# Patient Record
Sex: Male | Born: 1980
Health system: Southern US, Community
[De-identification: ages and names within clinical notes are randomized; demographics above are authoritative.]

## PROBLEM LIST (undated history)

## (undated) DIAGNOSIS — J45909 Unspecified asthma, uncomplicated: Secondary | ICD-10-CM

## (undated) HISTORY — PX: JOINT REPLACEMENT: SHX530

## (undated) SURGERY — Surgical Case
Anesthesia: *Unknown

---

## 2003-12-03 ENCOUNTER — Emergency Department (HOSPITAL_COMMUNITY): Admission: EM | Admit: 2003-12-03 | Discharge: 2003-12-03 | Payer: Self-pay | Admitting: Family Medicine

## 2009-07-09 ENCOUNTER — Emergency Department (HOSPITAL_COMMUNITY): Admission: EM | Admit: 2009-07-09 | Discharge: 2009-07-09 | Payer: Self-pay | Admitting: Emergency Medicine
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
## 2011-09-12 ENCOUNTER — Emergency Department (HOSPITAL_COMMUNITY)
Admission: EM | Admit: 2011-09-12 | Discharge: 2011-09-12 | Disposition: A | Discharge status: Home / Self Care | Payer: Worker's Compensation | Attending: Emergency Medicine | Admitting: Emergency Medicine

## 2011-09-12 ENCOUNTER — Encounter (HOSPITAL_COMMUNITY): Payer: Self-pay | Admitting: *Deleted

## 2011-09-12 DIAGNOSIS — S91309A Unspecified open wound, unspecified foot, initial encounter: Secondary | ICD-10-CM | POA: Insufficient documentation

## 2011-09-12 DIAGNOSIS — Y9301 Activity, walking, marching and hiking: Secondary | ICD-10-CM | POA: Insufficient documentation

## 2011-09-12 DIAGNOSIS — Y998 Other external cause status: Secondary | ICD-10-CM | POA: Insufficient documentation

## 2011-09-12 DIAGNOSIS — W268XXA Contact with other sharp object(s), not elsewhere classified, initial encounter: Secondary | ICD-10-CM | POA: Insufficient documentation

## 2011-09-12 DIAGNOSIS — S91332A Puncture wound without foreign body, left foot, initial encounter: Secondary | ICD-10-CM

## 2011-09-12 HISTORY — DX: Unspecified asthma, uncomplicated: J45.909

## 2011-09-12 MED ORDER — TETANUS-DIPHTH-ACELL PERTUSSIS 5-2.5-18.5 LF-MCG/0.5 IM SUSP
0.5000 mL | Freq: Once | INTRAMUSCULAR | Status: AC
  Administered 2011-09-12: 0.5 mL via INTRAMUSCULAR
  Filled 2011-09-12: qty 0.5

## 2011-09-12 MED ORDER — HYDROCODONE-ACETAMINOPHEN 5-325 MG PO TABS: 1.0000 | ORAL_TABLET | Freq: Four times a day (QID) | ORAL | Status: AC | PRN

## 2011-09-12 NOTE — ED Provider Notes (Signed)
History     CSN: [id number]  Arrival date & time 09/12/11  2031   First MD Initiated Contact with Patient 09/12/11 2214      Chief Complaint  Patient presents with  . Foot Pain    (Consider location/radiation/quality/duration/timing/severity/associated sxs/prior treatment) HPI Patient presents emergency department after stepping on a nail with his left foot.  Patient believes his last tetanus shot was 5 years ago, but is unsure.  Patient, states that his swelling and pain to the area around the wound.The patient states he is unsure how far the nail went into the foot. The patient denies numbness or weakness. Past Medical History  Diagnosis Date  . Asthma     History reviewed. No pertinent past surgical history.  History reviewed. No pertinent family history.  History  Substance Use Topics  . Smoking status: Never Smoker   . Smokeless tobacco: Not on file  . Alcohol Use: No      Review of Systems All other systems negative except as documented in the HPI. All pertinent positives and negatives as reviewed in the HPI.  Allergies  Penicillins  Home Medications  No current outpatient prescriptions on file.  BP 149/86  Pulse 83  Temp(Src) 98.1 F (36.7 C) (Oral)  Resp 18  Wt 215 lb (97.523 kg)  SpO2 100%  Physical Exam  Nursing note and vitals reviewed. Constitutional: He appears well-developed and well-nourished.  Musculoskeletal:       Feet:    ED Course  Procedures (including critical care time)  Patient's tetanus was updated is advised to soak the foot in warm water with Epsom salts and return here as needed for any worsening in his condition.   MDM          Carlyle Dolly, PA-C 09/12/11 2315

## 2011-09-12 NOTE — ED Provider Notes (Signed)
Medical screening examination/treatment/procedure(s) were performed by non-physician practitioner and as supervising physician I was immediately available for consultation/collaboration.   Forbes Cellar, MD 09/12/11 2355

## 2011-09-12 NOTE — Discharge Instructions (Signed)
Soak in warm water and epsom salts.Return here as needed. Keep area clean with soap and water.

## 2011-09-12 NOTE — ED Notes (Signed)
Pt c/o stepped on nail tonight; left foot; last tetanus five years ago

## 2014-11-27 ENCOUNTER — Emergency Department (HOSPITAL_COMMUNITY)
Admission: EM | Admit: 2014-11-27 | Discharge: 2014-11-27 | Discharge status: Against Medical Advice | Payer: 59 | Source: Home / Self Care | Attending: Family Medicine | Admitting: Family Medicine

## 2014-11-27 ENCOUNTER — Encounter (HOSPITAL_COMMUNITY): Payer: Self-pay | Admitting: *Deleted

## 2014-11-27 DIAGNOSIS — R1310 Dysphagia, unspecified: Secondary | ICD-10-CM

## 2014-11-27 NOTE — ED Provider Notes (Signed)
CSN: [id number]     Arrival date & time 11/27/14  1735 History   First MD Initiated Contact with Patient 11/27/14 1747     Chief Complaint  Patient presents with  . Dysphagia   (Consider location/radiation/quality/duration/timing/severity/associated sxs/prior Treatment) Patient is a 34 y.o. male presenting with GI illness. The history is provided by the patient.  GI Problem This is a new problem. The current episode started yesterday. The problem has been gradually worsening (only able to tolerate sips of liquids today, no solid food, no h/o reflux). Pertinent negatives include no chest pain, no abdominal pain and no shortness of breath. The symptoms are aggravated by swallowing and drinking.    Past Medical History  Diagnosis Date  . Asthma    No past surgical history on file. No family history on file. Social History  Substance Use Topics  . Smoking status: Never Smoker   . Smokeless tobacco: Not on file  . Alcohol Use: No    Review of Systems  Constitutional: Positive for appetite change. Negative for fever.  Respiratory: Negative for shortness of breath.   Cardiovascular: Negative for chest pain.  Gastrointestinal: Negative for abdominal pain.    Allergies  Penicillins  Home Medications   Prior to Admission medications   Not on File   Meds Ordered and Administered this Visit  Medications - No data to display  BP 142/93 mmHg  Pulse 91  Temp(Src) 98 F (36.7 C) (Oral)  Resp 16  SpO2 96% No data found.   Physical Exam  Constitutional: He is oriented to person, place, and time. He appears well-developed and well-nourished. No distress.  HENT:  Mouth/Throat: Oropharynx is clear and moist.  Eyes: Conjunctivae are normal. Pupils are equal, round, and reactive to light.  Neck: Normal range of motion. Neck supple.  Abdominal: Soft. Bowel sounds are normal. There is no tenderness.  Lymphadenopathy:    He has no cervical adenopathy.  Neurological: He is alert  and oriented to person, place, and time.  Skin: Skin is warm and dry.  Nursing note and vitals reviewed.   ED Course  Procedures (including critical care time)  Labs Review Labs Reviewed - No data to display  Imaging Review No results found.   Visual Acuity Review  Right Eye Distance:   Left Eye Distance:   Bilateral Distance:    Right Eye Near:   Left Eye Near:    Bilateral Near:         MDM   1. Dysphagia, idiopathic    Sent for GI eval of swallowing difficulty since yest.    Linna Hoff, MD 11/27/14 [phone]

## 2014-11-27 NOTE — ED Notes (Addendum)
Pt  Was  Advised   To    Be  Transferred   To  The  Er    By  Shuttle and  He  Refused    He  Was  Advised        The  Dangers   Of     Not  Going     He  Said  He  Would  Go later          He  Signed  Release  And  Left  ama      Dr  Zollie Scale  With  Patient      As    well

## 2014-11-27 NOTE — ED Notes (Signed)
Pt    Reports  He  Is  Unable  To swallow     Anything  But small  Amounts  Of  Liquids          he  Reports  The  Symptoms  Began   Today  He  Has  Had  Some  Belching       And  Some  Intolerances  To spicy  Food  But  denys  Any  specefic  gerd     History    Airway is  Intact  And  He  Is   Speaking in  Complete   sentances

## 2014-11-28 ENCOUNTER — Encounter (HOSPITAL_COMMUNITY)
Admission: EM | Disposition: A | Discharge status: Home / Self Care | Payer: Self-pay | Source: Home / Self Care | Attending: Emergency Medicine

## 2014-11-28 ENCOUNTER — Encounter (HOSPITAL_COMMUNITY): Payer: Self-pay

## 2014-11-28 ENCOUNTER — Emergency Department (HOSPITAL_COMMUNITY)
Admission: EM | Admit: 2014-11-28 | Discharge: 2014-11-28 | Disposition: A | Discharge status: Home / Self Care | Payer: 59 | Attending: Emergency Medicine | Admitting: Emergency Medicine

## 2014-11-28 DIAGNOSIS — Z880 Allergy status to penicillin: Secondary | ICD-10-CM | POA: Insufficient documentation

## 2014-11-28 DIAGNOSIS — K200 Eosinophilic esophagitis: Secondary | ICD-10-CM | POA: Diagnosis not present

## 2014-11-28 DIAGNOSIS — J45909 Unspecified asthma, uncomplicated: Secondary | ICD-10-CM | POA: Insufficient documentation

## 2014-11-28 DIAGNOSIS — R1310 Dysphagia, unspecified: Secondary | ICD-10-CM

## 2014-11-28 HISTORY — PX: ESOPHAGOGASTRODUODENOSCOPY: SHX5428

## 2014-11-28 SURGERY — EGD (ESOPHAGOGASTRODUODENOSCOPY)
Anesthesia: Moderate Sedation

## 2014-11-28 MED ORDER — MIDAZOLAM HCL 5 MG/ML IJ SOLN
INTRAMUSCULAR | Status: AC
  Filled 2014-11-28: qty 2

## 2014-11-28 MED ORDER — BUTAMBEN-TETRACAINE-BENZOCAINE 2-2-14 % EX AERO
INHALATION_SPRAY | CUTANEOUS | Status: DC | PRN
  Administered 2014-11-28: 2 via TOPICAL

## 2014-11-28 MED ORDER — FENTANYL CITRATE (PF) 100 MCG/2ML IJ SOLN
INTRAMUSCULAR | Status: AC
  Filled 2014-11-28: qty 2

## 2014-11-28 MED ORDER — SODIUM CHLORIDE 0.9 % IV SOLN
INTRAVENOUS | Status: DC
Start: 2014-11-28 — End: 2014-11-28

## 2014-11-28 NOTE — H&P (Signed)
   [facility] Admission History & Physical  Chief Complaint: Acute onset of dysphagia HPI: John Lamb is an 34 y.o. white male.  Who developed onset of sensation of food stuck in his esophagus while eating steak. He did not eat any after that. This was 2 nights ago. He did not have any trouble handling his secretions but the next morning when he ate breakfast he could not hold any solid food down and continually regurgitated after every attempt. He also has difficulty with liquids but can keep him down. This is all of acute onset. He was seen in the emergency room today and I was consulted to see him. He does have a history of significant belching and intermittent heartburn and has a reflux type sensation when he tries to drink anything in the last 2 days. He does have a history of asthma  Past Medical History  Diagnosis Date  . Asthma     History reviewed. No pertinent past surgical history.   (Not in a hospital admission)  Allergies:  Allergies  Allergen Reactions  . Penicillins     childhood    History reviewed. No pertinent family history.  Social History:  reports that he has never smoked. He does not have any smokeless tobacco history on file. He reports that he does not drink alcohol. His drug history is not on file.  Review of Systems: negative except as above   Blood pressure 113/74, pulse 60, temperature 97.9 F (36.6 C), temperature source Oral, resp. rate 18, height  (1.905 m), weight 95.255 kg (210 lb), SpO2 97 %. Head: Normocephalic, without obvious abnormality, atraumatic Neck: no adenopathy, no carotid bruit, no JVD, supple, symmetrical, trachea midline and thyroid not enlarged, symmetric, no tenderness/mass/nodules Resp: clear to auscultation bilaterally Cardio: regular rate and rhythm, S1, S2 normal, no murmur, click, rub or gallop GI: Abdomen soft nondistended with normoactive bowel sounds. No S4 mainly mass or guarding Extremities:  extremities normal, atraumatic, no cyanosis or edema  No results found for this or any previous visit (from the past 48 hour(s)). No results found.  Assessment: Dysphagia rule out food impaction rule out eosinophilic esophagitis or reflux-related stricture Plan: EGD with possible foreign body removal possible biopsy. Risks rationale and alternatives explained and he wishes to proceed.  Illana Nolting C 11/28/2014, 3:34 PM

## 2014-11-28 NOTE — ED Notes (Signed)
Pt here and reports he feels like his throat is restricted. Pt has still been able to eat. Denies any pain at all. Pt breathing is e/u. No swelling or redness noted to throat.

## 2014-11-28 NOTE — ED Notes (Signed)
Pt transported to ENDO 

## 2014-11-28 NOTE — Discharge Instructions (Signed)
Please read and follow all provided instructions.  Your diagnoses today include:  1. Dysphagia    Tests performed today include:  Vital signs. See below for your results today.   Medications prescribed:   None Take any prescribed medications only as directed.  Home care instructions:  Follow any educational materials contained in this packet.  Maintain good fluid intake and eat soft foods until seen by a GI physician.   Follow-up instructions: Please follow-up with your primary care provider or the gastroenterologist listed in the next 3 days for further evaluation of your symptoms.   Return instructions:   Please return to the Emergency Department if you experience worsening symptoms.   Please return if you have any other emergent concerns.  Additional Information:  Your vital signs today were: BP 136/93 mmHg   Pulse 78   Temp(Src) 97.9 F (36.6 C) (Oral)   Resp 18   Ht  (1.905 m)   Wt 210 lb (95.255 kg)   BMI 26.25 kg/m2   SpO2 98% If your blood pressure (BP) was elevated above 135/85 this visit, please have this repeated by your doctor within one month. --------------

## 2014-11-28 NOTE — ED Provider Notes (Signed)
CSN: [id number]     Arrival date & time 11/28/14  0845 History   First MD Initiated Contact with Patient 11/28/14 [phone]     Chief Complaint  Patient presents with  . Dysphagia     (Consider location/radiation/quality/duration/timing/severity/associated sxs/prior Treatment) HPI Comments: Patient presents with complaint of dysphasia for the past 2 days and heartburn over the past year. Patient states that he has not been able to eat or drink anything except for small sips and bites of solids and liquids over the past 2 days. If he tries to drink multiple sips or swallow multiple bites, he states the food or liquid will come back up. Patient has had worsening heartburn over the past year and he is tried multiple over-the-counter treatments for this without much relief. He states that he eats a lot of spicy foods even though this makes the heart burn symptoms worse. He denies fevers, chest pain, pain with movement of his neck. No other weakness elsewhere. The onset of this condition was acute. The course is constant. Aggravating factors: none. Alleviating factors: none.    The history is provided by the patient and medical records.    Past Medical History  Diagnosis Date  . Asthma    History reviewed. No pertinent past surgical history. History reviewed. No pertinent family history. Social History  Substance Use Topics  . Smoking status: Never Smoker   . Smokeless tobacco: None  . Alcohol Use: No    Review of Systems  Constitutional: Negative for fever.  HENT: Positive for trouble swallowing. Negative for rhinorrhea and sore throat.   Eyes: Negative for redness.  Respiratory: Negative for cough.   Cardiovascular: Negative for chest pain.  Gastrointestinal: Negative for nausea, vomiting, abdominal pain and diarrhea.  Genitourinary: Negative for dysuria.  Musculoskeletal: Negative for myalgias.  Skin: Negative for rash.  Neurological: Negative for headaches.    Allergies   Penicillins  Home Medications   Prior to Admission medications   Not on File   BP 142/93 mmHg  Pulse 93  Temp(Src) 97.9 F (36.6 C) (Oral)  Resp 16  Ht  (1.905 m)  Wt 210 lb (95.255 kg)  BMI 26.25 kg/m2  SpO2 100%   Physical Exam  Constitutional: He appears well-developed and well-nourished.  HENT:  Head: Normocephalic and atraumatic.  Right Ear: Tympanic membrane, external ear and ear canal normal.  Left Ear: Tympanic membrane, external ear and ear canal normal.  Nose: Nose normal. No mucosal edema or rhinorrhea.  Mouth/Throat: Uvula is midline, oropharynx is clear and moist and mucous membranes are normal. Mucous membranes are not dry. No trismus in the jaw. No uvula swelling. No oropharyngeal exudate, posterior oropharyngeal edema, posterior oropharyngeal erythema or tonsillar abscesses.  Eyes: Conjunctivae are normal. Right eye exhibits no discharge. Left eye exhibits no discharge.  Neck: Normal range of motion. Neck supple.  Cardiovascular: Normal rate, regular rhythm and normal heart sounds.   Pulmonary/Chest: Effort normal and breath sounds normal. No respiratory distress. He has no wheezes. He has no rales.  Abdominal: Soft. There is no tenderness.  Neurological: He is alert.  Skin: Skin is warm and dry.  Psychiatric: He has a normal mood and affect.  Nursing note and vitals reviewed.   ED Course  Procedures (including critical care time) Labs Review Labs Reviewed - No data to display  Imaging Review No results found. I have personally reviewed and evaluated these images and lab results as part of my medical decision-making.   EKG Interpretation  None       9:34 AM Patient seen and examined. Work-up initiated. Medications ordered.   Vital signs reviewed and are as follows: BP 136/93 mmHg  Pulse 78  Temp(Src) 97.9 F (36.6 C) (Oral)  Resp 18  Ht 6\' 3"  (1.905 m)  Wt 210 lb (95.255 kg)  BMI 26.25 kg/m2  SpO2 98%  Patient is able to swallow  small sips of water at bedside, states he feels a burning sensation at base of throat a few seconds after swallowing and a gurgling sensation. Discussed with Dr. Dalene Seltzer.   10:36 AM D/c to home with GI referral. Encouraged soft foods and shakes. Return if unable to tolerate fluids, pain, new symptoms. Patient verbalizes understanding and agrees with plan.     11:14 AM Received call from Endo. Patient's wife is an echo tech here. She has found a GI physician to agree to see patient today. Requested that we hold patient here for transport to Endo if bed is available.   MDM   Final diagnoses:  Dysphagia   Patient with dysphasia but able to tolerate some liquids as demonstrated bedside without regurgitation or vomiting. Description of symptoms sounds most like a dysmotility issue. No indications of infection, retropharyngeal abscess, epiglottitis, other deep space abscess and neck. No systemic symptoms of illness include fever. Patient appears well hydrated. No chest pain.    Renne Crigler, PA-C 11/28/14 1115  Alvira Monday, MD 12/01/14 [phone]

## 2014-12-02 ENCOUNTER — Encounter (HOSPITAL_COMMUNITY): Payer: Self-pay | Admitting: Gastroenterology

## 2015-05-20 NOTE — Procedures (Signed)
EGD report: Esophagus: No foreign body, furrowing  and ringlike indentations most consistent with eosinophilic esophagitis. Biopsies taken.  Stomach: Normal  Duodenum: Normal   Impression: Esophagitis consistent with eosinophilic esophagitis, await biopsy results  Plan: Treat with proton pump inhibitor, await biopsies and consider treatment with oral fluticasone

## 2015-06-15 DIAGNOSIS — J45909 Unspecified asthma, uncomplicated: Secondary | ICD-10-CM | POA: Diagnosis not present

## 2015-06-15 DIAGNOSIS — J301 Allergic rhinitis due to pollen: Secondary | ICD-10-CM | POA: Diagnosis not present

## 2016-02-27 DIAGNOSIS — J4531 Mild persistent asthma with (acute) exacerbation: Secondary | ICD-10-CM | POA: Diagnosis not present

## 2016-02-27 DIAGNOSIS — Z7689 Persons encountering health services in other specified circumstances: Secondary | ICD-10-CM | POA: Diagnosis not present

## 2017-09-12 ENCOUNTER — Ambulatory Visit: Payer: Self-pay | Admitting: Family Medicine

## 2018-03-30 DIAGNOSIS — Z760 Encounter for issue of repeat prescription: Secondary | ICD-10-CM | POA: Diagnosis not present

## 2018-04-01 DIAGNOSIS — J4521 Mild intermittent asthma with (acute) exacerbation: Secondary | ICD-10-CM | POA: Diagnosis not present

## 2018-07-06 MED FILL — QVAR REDIHALER 80 MCG/ACT A: 80 | 30 days supply | Qty: 11 | Fill #0

## 2019-02-18 MED FILL — PROAIR RESPICLICK INHAL PWD: 108 (90 BAS | 17 days supply | Qty: 1 | Fill #0

## 2019-02-18 MED FILL — predniSONE 10 MG TABS: 10 | 12 days supply | Qty: 48 | Fill #0

## 2019-03-25 DIAGNOSIS — Z03818 Encounter for observation for suspected exposure to other biological agents ruled out: Secondary | ICD-10-CM | POA: Diagnosis not present

## 2019-05-09 MED FILL — QVAR REDIHALER 80 MCG/ACT A: 80 | 60 days supply | Qty: 11 | Fill #0

## 2019-12-04 MED FILL — QVAR REDIHALER 80 MCG/ACT A: 80 | 60 days supply | Qty: 11 | Fill #2

## 2020-03-21 ENCOUNTER — Other Ambulatory Visit (HOSPITAL_COMMUNITY): Payer: Self-pay

## 2020-03-24 MED FILL — QVAR REDIHALER 80 MCG/ACT A: 80 | 60 days supply | Qty: 11 | Fill #0

## 2020-09-27 ENCOUNTER — Emergency Department (HOSPITAL_BASED_OUTPATIENT_CLINIC_OR_DEPARTMENT_OTHER)
Admission: EM | Admit: 2020-09-27 | Discharge: 2020-09-28 | Disposition: A | Discharge status: Home / Self Care | Payer: No Typology Code available for payment source | Attending: Emergency Medicine | Admitting: Emergency Medicine

## 2020-09-27 ENCOUNTER — Other Ambulatory Visit: Payer: Self-pay

## 2020-09-27 ENCOUNTER — Encounter (HOSPITAL_BASED_OUTPATIENT_CLINIC_OR_DEPARTMENT_OTHER): Payer: Self-pay

## 2020-09-27 ENCOUNTER — Emergency Department (HOSPITAL_BASED_OUTPATIENT_CLINIC_OR_DEPARTMENT_OTHER): Payer: No Typology Code available for payment source

## 2020-09-27 DIAGNOSIS — J45909 Unspecified asthma, uncomplicated: Secondary | ICD-10-CM | POA: Insufficient documentation

## 2020-09-27 DIAGNOSIS — Y92828 Other wilderness area as the place of occurrence of the external cause: Secondary | ICD-10-CM | POA: Diagnosis not present

## 2020-09-27 DIAGNOSIS — T1490XA Injury, unspecified, initial encounter: Secondary | ICD-10-CM

## 2020-09-27 DIAGNOSIS — W25XXXA Contact with sharp glass, initial encounter: Secondary | ICD-10-CM | POA: Insufficient documentation

## 2020-09-27 DIAGNOSIS — S62616B Displaced fracture of proximal phalanx of right little finger, initial encounter for open fracture: Secondary | ICD-10-CM | POA: Insufficient documentation

## 2020-09-27 DIAGNOSIS — Z7951 Long term (current) use of inhaled steroids: Secondary | ICD-10-CM | POA: Diagnosis not present

## 2020-09-27 DIAGNOSIS — Z96652 Presence of left artificial knee joint: Secondary | ICD-10-CM | POA: Insufficient documentation

## 2020-09-27 DIAGNOSIS — S61216A Laceration without foreign body of right little finger without damage to nail, initial encounter: Secondary | ICD-10-CM

## 2020-09-27 DIAGNOSIS — T148XXA Other injury of unspecified body region, initial encounter: Secondary | ICD-10-CM

## 2020-09-27 DIAGNOSIS — S6991XA Unspecified injury of right wrist, hand and finger(s), initial encounter: Secondary | ICD-10-CM | POA: Diagnosis present

## 2020-09-27 MED ORDER — CEPHALEXIN 250 MG PO CAPS
500.0000 mg | ORAL_CAPSULE | Freq: Once | ORAL | Status: AC
  Administered 2020-09-27: 500 mg via ORAL
  Filled 2020-09-27: qty 2

## 2020-09-27 MED ORDER — CEPHALEXIN 250 MG PO CAPS: 250.0000 mg | ORAL_CAPSULE | Freq: Four times a day (QID) | ORAL | 0 refills | Status: DC

## 2020-09-27 MED ORDER — DOXYCYCLINE HYCLATE 100 MG PO TABS
100.0000 mg | ORAL_TABLET | Freq: Once | ORAL | Status: AC
  Administered 2020-09-27: 100 mg via ORAL
  Filled 2020-09-27: qty 1

## 2020-09-27 MED ORDER — DOXYCYCLINE HYCLATE 100 MG PO CAPS: 100.0000 mg | ORAL_CAPSULE | Freq: Two times a day (BID) | ORAL | 0 refills | Status: DC

## 2020-09-27 MED ORDER — LIDOCAINE HCL (PF) 1 % IJ SOLN
30.0000 mL | Freq: Once | INTRAMUSCULAR | Status: AC
  Administered 2020-09-27: 30 mL
  Filled 2020-09-27: qty 30
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
## 2020-09-27 NOTE — Discharge Instructions (Addendum)
Expect a call from [facility] tomorrow - they will try to see you as soon as possible. Keep the finger in splint as there is a fracture at the base of the finger. Take the antibiotics prescribed.  RETURN TO THE ER IF THERE IS INCREASED PAIN, REDNESS, PUS COMING OUT from the wound site.
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
## 2020-09-27 NOTE — ED Notes (Signed)
MD at bedside for sutures 

## 2020-09-28 NOTE — ED Notes (Signed)
Pt verbalizes understanding of discharge instructions. Opportunity for questioning and answers were provided. Armand removed by staff, pt discharged from ED to home. Pt educated to f/u with hand surgery.

## 2021-10-21 ENCOUNTER — Encounter: Payer: Self-pay | Admitting: Family Medicine

## 2021-10-21 ENCOUNTER — Ambulatory Visit (INDEPENDENT_AMBULATORY_CARE_PROVIDER_SITE_OTHER): Payer: No Typology Code available for payment source | Admitting: Family Medicine

## 2021-10-21 VITALS — BP 122/82 | HR 72 | Temp 97.0°F | Ht 73.0 in | Wt 231.0 lb

## 2021-10-21 DIAGNOSIS — K200 Eosinophilic esophagitis: Secondary | ICD-10-CM | POA: Diagnosis not present

## 2021-10-21 DIAGNOSIS — J45909 Unspecified asthma, uncomplicated: Secondary | ICD-10-CM | POA: Diagnosis not present

## 2021-10-21 DIAGNOSIS — S6991XS Unspecified injury of right wrist, hand and finger(s), sequela: Secondary | ICD-10-CM | POA: Diagnosis not present

## 2021-10-21 DIAGNOSIS — S6991XA Unspecified injury of right wrist, hand and finger(s), initial encounter: Secondary | ICD-10-CM | POA: Insufficient documentation

## 2021-10-21 MED ORDER — OMEPRAZOLE MAGNESIUM 20 MG PO TBEC: 20.0000 mg | DELAYED_RELEASE_TABLET | Freq: Every day | ORAL | 2 refills | Status: DC

## 2021-10-21 MED ORDER — FLUTICASONE PROPIONATE HFA 110 MCG/ACT IN AERO: 1.0000 | INHALATION_SPRAY | Freq: Every day | RESPIRATORY_TRACT | 12 refills | Status: DC

## 2021-10-21 MED ORDER — ALBUTEROL SULFATE HFA 108 (90 BASE) MCG/ACT IN AERS: 2.0000 | INHALATION_SPRAY | Freq: Four times a day (QID) | RESPIRATORY_TRACT | 0 refills | Status: DC | PRN

## 2021-10-21 NOTE — Patient Instructions (Signed)
WE are refilling albuterol, starting Flovent for control inhaler For esophogitis, we refilled your omeprazole.
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
## 2021-10-21 NOTE — Assessment & Plan Note (Signed)
Has upcoming planned visit with orthopedics already scheduled

## 2021-10-21 NOTE — Assessment & Plan Note (Signed)
Refilled omeprazole 20 mg daily Follow-up as needed

## 2021-10-21 NOTE — Assessment & Plan Note (Signed)
DC Qvar Start Flovent as prescribed Albuterol inhaler refilled

## 2021-10-26 ENCOUNTER — Encounter: Payer: No Typology Code available for payment source | Admitting: Family Medicine

## 2021-11-24 ENCOUNTER — Encounter: Payer: Self-pay | Admitting: Family Medicine

## 2021-11-24 ENCOUNTER — Ambulatory Visit (INDEPENDENT_AMBULATORY_CARE_PROVIDER_SITE_OTHER): Payer: No Typology Code available for payment source | Admitting: Family Medicine

## 2021-11-24 VITALS — BP 126/82 | HR 77 | Temp 97.5°F | Ht 73.0 in | Wt 235.6 lb

## 2021-11-24 DIAGNOSIS — Z6831 Body mass index (BMI) 31.0-31.9, adult: Secondary | ICD-10-CM | POA: Diagnosis not present

## 2021-11-24 DIAGNOSIS — R222 Localized swelling, mass and lump, trunk: Secondary | ICD-10-CM

## 2021-11-24 DIAGNOSIS — K200 Eosinophilic esophagitis: Secondary | ICD-10-CM

## 2021-11-24 DIAGNOSIS — Z1159 Encounter for screening for other viral diseases: Secondary | ICD-10-CM

## 2021-11-24 DIAGNOSIS — E669 Obesity, unspecified: Secondary | ICD-10-CM | POA: Diagnosis not present

## 2021-11-24 DIAGNOSIS — Z114 Encounter for screening for human immunodeficiency virus [HIV]: Secondary | ICD-10-CM

## 2021-11-24 DIAGNOSIS — Z0000 Encounter for general adult medical examination without abnormal findings: Secondary | ICD-10-CM

## 2021-11-24 LAB — COMPREHENSIVE METABOLIC PANEL
ALT: 31 U/L (ref 0–53)
AST: 24 U/L (ref 0–37)
Albumin: 4.5 g/dL (ref 3.5–5.2)
Alkaline Phosphatase: 57 U/L (ref 39–117)
BUN: 11 mg/dL (ref 6–23)
CO2: 29 mEq/L (ref 19–32)
Calcium: 9.3 mg/dL (ref 8.4–10.5)
Chloride: 100 mEq/L (ref 96–112)
Creatinine, Ser: 1.2 mg/dL (ref 0.40–1.50)
GFR: 76.32 mL/min (ref >60.00)
Glucose, Bld: 98 mg/dL (ref 70–99)
Potassium: 4.2 mEq/L (ref 3.5–5.1)
Sodium: 139 mEq/L (ref 135–145)
Total Bilirubin: 0.4 mg/dL (ref 0.2–1.2)
Total Protein: 7.4 g/dL (ref 6.0–8.3)

## 2021-11-24 LAB — CBC WITH DIFFERENTIAL/PLATELET
Basophils Absolute: 0.1 10*3/uL (ref 0.0–0.1)
Basophils Relative: 1 % (ref 0.0–3.0)
Eosinophils Absolute: 1 10*3/uL — ABNORMAL HIGH (ref 0.0–0.7)
Eosinophils Relative: 14.5 % — ABNORMAL HIGH (ref 0.0–5.0)
HCT: 44.2 % (ref 39.0–52.0)
Hemoglobin: 14.7 g/dL (ref 13.0–17.0)
Lymphocytes Relative: 32 % (ref 12.0–46.0)
Lymphs Abs: 2.2 10*3/uL (ref 0.7–4.0)
MCHC: 33.3 g/dL (ref 30.0–36.0)
MCV: 88.8 fl (ref 78.0–100.0)
Monocytes Absolute: 0.5 10*3/uL (ref 0.1–1.0)
Monocytes Relative: 6.7 % (ref 3.0–12.0)
Neutro Abs: 3.2 10*3/uL (ref 1.4–7.7)
Neutrophils Relative %: 45.8 % (ref 43.0–77.0)
Platelets: 228 10*3/uL (ref 150.0–400.0)
RBC: 4.98 Mil/uL (ref 4.22–5.81)
RDW: 14.8 % (ref 11.5–15.5)
WBC: 7 10*3/uL (ref 4.0–10.5)

## 2021-11-24 LAB — LDL CHOLESTEROL, DIRECT: Direct LDL: 160 mg/dL

## 2021-11-24 LAB — URINALYSIS, ROUTINE W REFLEX MICROSCOPIC
Bilirubin Urine: NEGATIVE
Hgb urine dipstick: NEGATIVE
Ketones, ur: NEGATIVE
Leukocytes,Ua: NEGATIVE
Nitrite: NEGATIVE
RBC / HPF: NONE SEEN (ref 0–?)
Specific Gravity, Urine: 1.015 (ref 1.000–1.030)
Total Protein, Urine: NEGATIVE
Urine Glucose: NEGATIVE
Urobilinogen, UA: 0.2 (ref 0.0–1.0)
pH: 7 (ref 5.0–8.0)

## 2021-11-24 LAB — LIPID PANEL
Cholesterol: 232 mg/dL — ABNORMAL HIGH (ref 0–200)
HDL: 37.9 mg/dL — ABNORMAL LOW (ref >39.00)
NonHDL: 194.23
Total CHOL/HDL Ratio: 6
Triglycerides: 277 mg/dL — ABNORMAL HIGH (ref 0.0–149.0)
VLDL: 55.4 mg/dL — ABNORMAL HIGH (ref 0.0–40.0)

## 2021-11-24 LAB — TSH: TSH: 1.93 u[IU]/mL (ref 0.35–5.50)

## 2021-11-24 LAB — HEMOGLOBIN A1C: Hgb A1c MFr Bld: 5.8 % (ref 4.6–6.5)

## 2021-11-24 MED ORDER — OMEPRAZOLE 20 MG PO CPDR: 20.0000 mg | DELAYED_RELEASE_CAPSULE | Freq: Two times a day (BID) | ORAL | 11 refills | Status: DC

## 2021-11-24 NOTE — Progress Notes (Signed)
Patient ID: John Lamb, male   DOB: 09-21-1980, 41 y.o.   MRN: [id number]

## 2021-11-24 NOTE — Assessment & Plan Note (Signed)
Counseled on exercise and diet Restratification labs as ordered

## 2021-11-24 NOTE — Progress Notes (Signed)
Chief Complaint:  John Lamb is a 41 y.o. male who presents today for his annual comprehensive physical exam.    Assessment/Plan:   Problem List Items Addressed This Visit       Digestive   Eosinophilic esophagitis    Improved on omeprazole 20 mg twice daily Medication refill sent Discussed with patient if no improvement would need to follow-up with GI      Relevant Medications   omeprazole (PRILOSEC) 20 MG capsule     Musculoskeletal and Integument   Nodule of skin of back    Likely lipoma versus sebaceous cyst, although cannot rule out occult malignancy Not bothersome at the moment Did discuss getting further imaging to work-up versus referral for removal and biopsy .  Patient elects to monitor for now      Relevant Orders   Ambulatory referral to Dermatology     Other   Class 1 obesity without serious comorbidity with body mass index (BMI) of 31.0 to 31.9 in adult    Counseled on exercise and diet Restratification labs as ordered      Relevant Orders   CBC with Differential   Comprehensive metabolic panel   Hemoglobin A1c   Lipid panel   TSH   Vitamin D 1,25 dihydroxy   Urinalysis, Routine w reflex microscopic   Other Visit Diagnoses     Routine general medical examination at a health care facility    -  Primary   Screening for HIV (human immunodeficiency virus)       Relevant Orders   HIV antibody   Encounter for hepatitis C screening test for low risk patient       Relevant Orders   Hepatitis C antibody screen        Patient Counseling(The following topics were reviewed and/or handout was given):  -Nutrition: Stressed importance of moderation in sodium/caffeine intake, saturated fat and cholesterol, caloric balance, sufficient intake of fresh fruits, vegetables, and fiber.  -Stressed the importance of regular exercise.   -Substance Abuse: Discussed cessation/primary prevention of tobacco, alcohol, or other drug use; driving or other  dangerous activities under the influence; availability of treatment for abuse.   -Injury prevention: Discussed safety belts, safety helmets, smoke detector, smoking near bedding or upholstery.   -Sexuality: Discussed sexually transmitted diseases, partner selection, use of condoms, avoidance of unintended pregnancy and contraceptive alternatives.   -Dental health: Discussed importance of regular tooth brushing, flossing, and dental visits.  -Health maintenance and immunizations reviewed. Please refer to Health maintenance section.  Return to care in 1 year for next preventative visit.     Subjective:  HPI:  GERD and eosinophilic esophagitis follow up:  The patient was last seen for GERD 1 months ago. Changes made since that visit include he is taking more omeprazole as it does not completely control symptoms.  He reports excellent compliance with treatment. He is not having side effects.   He IS experiencing heartburn, when not taking omeprazole or when he eats spicy food. He is NOT experiencing difficulty swallowing, dysphagia, early satiety, hematemesis, midespigastric pain, shortness of breath, or unexpected weight loss  -----------------------------------------------------------------------------------------   Lifestyle Diet: Some spicy food, Exercise: Reports that he used to go to the gym, he but it does actively race dirt bikes, and do the Peloton occasionally     11/24/2021    9:45 AM  Depression screen PHQ 2/9  Decreased Interest 0  Down, Depressed, Hopeless 0  PHQ - 2 Score 0    Health  Maintenance Due  Topic Date Due   Hepatitis C Screening  Never done       ROS: As per HPI, otherwise all systems reviewed and are negative  PMH:  The following were reviewed and entered/updated in epic: Past Medical History:  Diagnosis Date   Asthma    Patient Active Problem List   Diagnosis Date Noted   Nodule of skin of back 11/24/2021   Class 1 obesity without  serious comorbidity with body mass index (BMI) of 31.0 to 31.9 in adult 11/24/2021   Injury of right little finger 10/21/2021   Uncomplicated asthma 10/21/2021   Eosinophilic esophagitis 10/21/2021   Past Surgical History:  Procedure Laterality Date   ESOPHAGOGASTRODUODENOSCOPY N/A 11/28/2014   Procedure: ESOPHAGOGASTRODUODENOSCOPY (EGD);  Surgeon: Dorena Cookey, MD;  Location: [facility] ENDOSCOPY;  Service: Endoscopy;  Laterality: N/A;   JOINT REPLACEMENT     no metal, left knee    History reviewed. No pertinent family history.  Medications- reviewed and updated Current Outpatient Medications  Medication Sig Dispense Refill   omeprazole (PRILOSEC) 20 MG capsule Take 1 capsule (20 mg total) by mouth 2 (two) times daily before a meal. 60 capsule 11   albuterol (VENTOLIN HFA) 108 (90 Base) MCG/ACT inhaler Inhale 2 puffs into the lungs every 6 (six) hours as needed for wheezing or shortness of breath. (Patient not taking: Reported on 11/24/2021) 8 g 0   fluticasone (FLOVENT HFA) 110 MCG/ACT inhaler Inhale 1 puff into the lungs daily. (Patient not taking: Reported on 11/24/2021) 1 each 12   No current facility-administered medications for this visit.    Allergies-reviewed and updated Allergies  Allergen Reactions   Iodine    Penicillins     childhood    Social History   Socioeconomic History   Marital status: Married    Spouse name: Not on file   Number of children: Not on file   Years of education: Not on file   Highest education level: Not on file  Occupational History   Not on file  Tobacco Use   Smoking status: Never   Smokeless tobacco: Not on file  Vaping Use   Vaping Use: Never used  Substance and Sexual Activity   Alcohol use: Yes    Comment: Social   Drug use: No   Sexual activity: Not on file  Other Topics Concern   Not on file  Social History Narrative   Not on file   Social Determinants of Health   Financial Resource Strain: Not on file  Food Insecurity: Not on  file  Transportation Needs: Not on file  Physical Activity: Not on file  Stress: Not on file  Social Connections: Not on file        Objective:  Physical Exam: BP 126/82 (BP Location: Left Arm, Patient Position: Sitting, Cuff Size: Large)   Pulse 77   Temp (!) 97.5 F (36.4 C) (Temporal)   Ht 6\' 1"  (1.854 m)   Wt 235 lb 9.6 oz (106.9 kg)   SpO2 98%   BMI 31.08 kg/m   Body mass index is 31.08 kg/m. Wt Readings from Last 3 Encounters:  11/24/21 235 lb 9.6 oz (106.9 kg)  10/21/21 231 lb (104.8 kg)  09/27/20 215 lb (97.5 kg)    Gen: NAD, resting comfortably CV: RRR with no murmurs appreciated Pulm: NWOB, CTAB with no crackles, wheezes, or rhonchi GI: Normal bowel sounds present. Soft, Nontender, Nondistended. MSK: no edema, cyanosis, or clubbing noted Skin: warm, dry, 1 cm nodule,  nontender, nonmobile located on midline of upper back Neuro: grossly normal, moves all extremities Psych: Normal affect and thought content      At today's visit, we discussed treatment options, associated risk and benefits, and engage in counseling as needed.  Additionally the following were reviewed: Past medical records, past medical and surgical history, family and social background, as well as relevant laboratory results, imaging findings, and specialty notes, where applicable.  This message was generated using dictation software, and as a result, it may contain unintentional typos or errors.  Nevertheless, extensive effort was made to accurately convey at the pertinent aspects of the patient visit.    There may have been are other unrelated non-urgent complaints, but due to the busy schedule and the amount of time already spent with him, time does not permit to address these issues at today's visit. Another appointment may have or has been requested to review these additional issues.   Thomes Dinning, MD, MS

## 2021-11-24 NOTE — Assessment & Plan Note (Signed)
Likely lipoma versus sebaceous cyst, although cannot rule out occult malignancy Not bothersome at the moment Did discuss getting further imaging to work-up versus referral for removal and biopsy .  Patient elects to monitor for now

## 2021-11-24 NOTE — Assessment & Plan Note (Signed)
Improved on omeprazole 20 mg twice daily Medication refill sent Discussed with patient if no improvement would need to follow-up with GI

## 2021-11-28 LAB — VITAMIN D 1,25 DIHYDROXY
Vitamin D 1, 25 (OH)2 Total: 40 pg/mL (ref 18–72)
Vitamin D2 1, 25 (OH)2: <8 pg/mL
Vitamin D3 1, 25 (OH)2: 40 pg/mL

## 2021-11-28 LAB — HIV ANTIBODY (ROUTINE TESTING W REFLEX): HIV 1&2 Ab, 4th Generation: NONREACTIVE

## 2021-11-28 LAB — HEPATITIS C ANTIBODY: Hepatitis C Ab: NONREACTIVE

## 2022-01-26 ENCOUNTER — Other Ambulatory Visit (HOSPITAL_COMMUNITY): Payer: Self-pay

## 2022-01-26 MED ORDER — ALBUTEROL SULFATE HFA 108 (90 BASE) MCG/ACT IN AERS
2.0000 | INHALATION_SPRAY | Freq: Four times a day (QID) | RESPIRATORY_TRACT | 0 refills | Status: AC | PRN
  Filled 2022-01-26: qty 6.7, 25d supply, fill #0

## 2022-01-26 MED ORDER — OMEPRAZOLE 20 MG PO CPDR
20.0000 mg | DELAYED_RELEASE_CAPSULE | Freq: Two times a day (BID) | ORAL | 9 refills | Status: DC
  Filled 2022-01-26 – 2022-03-04 (×2): qty 60, 30d supply, fill #0
  Filled 2022-06-01: qty 60, 30d supply, fill #1

## 2022-01-26 MED ORDER — FLUTICASONE PROPIONATE HFA 110 MCG/ACT IN AERO
1.0000 | INHALATION_SPRAY | Freq: Every day | RESPIRATORY_TRACT | 12 refills | Status: DC
  Filled 2022-01-26: qty 12, 30d supply, fill #0

## 2022-01-26 MED ORDER — OMEPRAZOLE 20 MG PO CPDR
20.0000 mg | DELAYED_RELEASE_CAPSULE | Freq: Every day | ORAL | 2 refills | Status: DC
  Filled 2022-01-26: qty 30, 30d supply, fill #0

## 2022-03-04 ENCOUNTER — Other Ambulatory Visit (HOSPITAL_COMMUNITY): Payer: Self-pay

## 2022-07-11 ENCOUNTER — Encounter: Payer: Self-pay | Admitting: Family Medicine

## 2022-07-11 DIAGNOSIS — K200 Eosinophilic esophagitis: Secondary | ICD-10-CM

## 2022-07-12 NOTE — Telephone Encounter (Signed)
Caller Name: Florindo Call back phone #: [phone]  Reason for Call: pt called to stress he is going out of the country tomorrow and is asking if this can be filled today.

## 2022-07-13 ENCOUNTER — Other Ambulatory Visit (HOSPITAL_COMMUNITY): Payer: Self-pay

## 2022-07-13 MED ORDER — FLUTICASONE PROPIONATE HFA 110 MCG/ACT IN AERO
1.0000 | INHALATION_SPRAY | Freq: Every day | RESPIRATORY_TRACT | 1 refills | Status: DC
  Filled 2022-07-13: qty 12, 90d supply, fill #0

## 2022-07-13 MED ORDER — OMEPRAZOLE 20 MG PO CPDR
20.0000 mg | DELAYED_RELEASE_CAPSULE | Freq: Two times a day (BID) | ORAL | 1 refills | Status: DC
  Filled 2022-07-13 – 2022-08-18 (×2): qty 180, 90d supply, fill #0
  Filled 2023-02-14: qty 180, 90d supply, fill #1

## 2022-07-13 NOTE — Telephone Encounter (Signed)
[place] [facility] Phone: [phone]  Fax: [phone]     Please send in flovent and omeprazole

## 2022-07-28 ENCOUNTER — Other Ambulatory Visit (HOSPITAL_COMMUNITY): Payer: Self-pay

## 2022-08-18 ENCOUNTER — Other Ambulatory Visit (HOSPITAL_COMMUNITY): Payer: Self-pay

## 2022-08-19 ENCOUNTER — Other Ambulatory Visit (HOSPITAL_COMMUNITY): Payer: Self-pay

## 2023-10-07 ENCOUNTER — Other Ambulatory Visit: Payer: Self-pay | Admitting: Nurse Practitioner

## 2023-10-07 DIAGNOSIS — K200 Eosinophilic esophagitis: Secondary | ICD-10-CM

## 2023-10-10 NOTE — Telephone Encounter (Signed)
 Requesting: omeprazole  (PRILOSEC ) 20 MG capsule  Last Visit: 11/24/2021 Next Visit: Visit date not found Last Refill: 07/13/2022  Please Advise

## 2023-10-11 ENCOUNTER — Other Ambulatory Visit (HOSPITAL_COMMUNITY): Payer: Self-pay

## 2023-10-11 ENCOUNTER — Encounter (HOSPITAL_COMMUNITY): Payer: Self-pay

## 2023-10-19 ENCOUNTER — Other Ambulatory Visit (HOSPITAL_COMMUNITY): Payer: Self-pay

## 2023-10-19 ENCOUNTER — Ambulatory Visit (INDEPENDENT_AMBULATORY_CARE_PROVIDER_SITE_OTHER): Admitting: Family Medicine

## 2023-10-19 ENCOUNTER — Encounter: Payer: Self-pay | Admitting: Family Medicine

## 2023-10-19 VITALS — BP 136/92 | HR 75 | Temp 97.3°F | Ht 73.0 in | Wt 239.0 lb

## 2023-10-19 DIAGNOSIS — Z0000 Encounter for general adult medical examination without abnormal findings: Secondary | ICD-10-CM

## 2023-10-19 DIAGNOSIS — Z6831 Body mass index (BMI) 31.0-31.9, adult: Secondary | ICD-10-CM | POA: Diagnosis not present

## 2023-10-19 DIAGNOSIS — E66811 Obesity, class 1: Secondary | ICD-10-CM

## 2023-10-19 DIAGNOSIS — J4520 Mild intermittent asthma, uncomplicated: Secondary | ICD-10-CM | POA: Diagnosis not present

## 2023-10-19 DIAGNOSIS — K200 Eosinophilic esophagitis: Secondary | ICD-10-CM | POA: Diagnosis not present

## 2023-10-19 DIAGNOSIS — I10 Essential (primary) hypertension: Secondary | ICD-10-CM | POA: Diagnosis not present

## 2023-10-19 MED ORDER — OMEPRAZOLE 20 MG PO CPDR
20.0000 mg | DELAYED_RELEASE_CAPSULE | Freq: Two times a day (BID) | ORAL | 3 refills | Status: AC
  Filled 2023-10-19: qty 180, 90d supply, fill #0
  Filled 2024-04-25: qty 180, 90d supply, fill #1

## 2023-10-19 NOTE — Progress Notes (Signed)
 Assessment  Assessment/Plan:   Assessment and Plan    Eosinophilic Esophagitis (EOE) Chronic eosinophilic esophagitis managed with omeprazole , effectively controlling symptoms, primarily acid reflux. Discussed alternative treatments like Flovent , advising against use without gastroenterologist consultation due to risks such as thrush. He prefers continuing PPI therapy and has difficulty scheduling with his gastroenterologist. Intermittent fasting discussed as a potential method to reduce EOE-related inflammation. - Refill omeprazole  20 mg twice daily - Consider baseline lab work including metabolic panel, liver, kidneys, thyroid, and eosinophil count  Asthma Asthma managed with Qvar, though not used for over a year. Previously required daily use but currently asymptomatic. Discussed insurance coverage issues with Qvar and Flovent , and availability of generics. No refill of Qvar needed.  Hypertension secondary to Obesity Mildly elevated blood pressure potentially associated with weight changes. He reports not being active or following a healthy diet. He is aware of the need to monitor blood pressure and its health impact. - Recommend lifestyle modifications for management - Screen for renal, liver, metabolic, endocrine dysfunction as ordered.  - Monitor blood pressure at a future visit  General Health Maintenance Discussed importance of vaccinations. He is due for pneumonia, tetanus, and Hepatitis B vaccines. - Administer tetanus vaccine - Consider pneumonia and Hepatitis B vaccines        Medications Discontinued During This Encounter  Medication Reason   fluticasone  (FLOVENT  HFA) 110 MCG/ACT inhaler    albuterol  (VENTOLIN  HFA) 108 (90 Base) MCG/ACT inhaler    omeprazole  (PRILOSEC ) 20 MG capsule Reorder    Patient Counseling(The following topics were reviewed and/or handout was given):  -Nutrition: Stressed importance of moderation in sodium/caffeine intake, saturated fat and  cholesterol, caloric balance, sufficient intake of fresh fruits, vegetables, and fiber.  -Stressed the importance of regular exercise.   -Substance Abuse: Discussed cessation/primary prevention of tobacco, alcohol, or other drug use; driving or other dangerous activities under the influence; availability of treatment for abuse.   -Injury prevention: Discussed safety belts, safety helmets, smoke detector, smoking near bedding or upholstery.   -Sexuality: Discussed sexually transmitted diseases, partner selection, use of condoms, avoidance of unintended pregnancy and contraceptive alternatives.   -Dental health: Discussed importance of regular tooth brushing, flossing, and dental visits.  -Health maintenance and immunizations reviewed. Please refer to Health maintenance section.  Return in about 1 week (around 10/26/2023) for fasting labs.        Subjective:   Encounter date: 10/19/2023  Chief Complaint  Patient presents with   Annual Exam    Discuss medications    Discussed the use of AI scribe software for clinical note transcription with the patient, who gave verbal consent to proceed.  History of Present Illness        10/19/2023    3:52 PM 11/24/2021    9:45 AM  Depression screen PHQ 2/9  Decreased Interest 0 0  Down, Depressed, Hopeless 0 0  PHQ - 2 Score 0 0  Altered sleeping 0   Tired, decreased energy 0   Change in appetite 0   Feeling bad or failure about yourself  0   Trouble concentrating 0   Moving slowly or fidgety/restless 0   Suicidal thoughts 0   PHQ-9 Score 0   Difficult doing work/chores Not difficult at all        10/19/2023    3:53 PM 11/24/2021    9:45 AM  GAD 7 : Generalized Anxiety Score  Nervous, Anxious, on Edge 0 0  Control/stop worrying 0 0  Worry too  much - different things 0 0  Trouble relaxing 0 0  Restless 0 0  Easily annoyed or irritable 0 0  Afraid - awful might happen 0 0  Total GAD 7 Score 0 0  Anxiety Difficulty Not  difficult at all Not difficult at all    Health Maintenance Due  Topic Date Due   Pneumococcal Vaccine 81-2 Years old (1 of 2 - PCV) Never done   Hepatitis B Vaccines (1 of 3 - 19+ 3-dose series) Never done   HPV VACCINES (1 - 3-dose SCDM series) Never done   DTaP/Tdap/Td (2 - Td or Tdap) 09/11/2021       PMH:  The following were reviewed and entered/updated in epic: Past Medical History:  Diagnosis Date   Asthma     Patient Active Problem List   Diagnosis Date Noted   Primary hypertension 10/21/2023   Nodule of skin of back 11/24/2021   Class 1 obesity without serious comorbidity with body mass index (BMI) of 31.0 to 31.9 in adult 11/24/2021   Injury of right little finger 10/21/2021   Uncomplicated asthma 10/21/2021   Eosinophilic esophagitis 10/21/2021    Past Surgical History:  Procedure Laterality Date   ESOPHAGOGASTRODUODENOSCOPY N/A 11/28/2014   Procedure: ESOPHAGOGASTRODUODENOSCOPY (EGD);  Surgeon: Norleen Hint, MD;  Location: [facility] ENDOSCOPY;  Service: Endoscopy;  Laterality: N/A;   JOINT REPLACEMENT     no metal, left knee    No family history on file.  Medications- reviewed and updated Outpatient Medications Prior to Visit  Medication Sig Dispense Refill   albuterol  (VENTOLIN  HFA) 108 (90 Base) MCG/ACT inhaler Inhale 2 puffs into the lungs every 6 (six) hours as needed for wheezing or shortness of breath 6.7 g 0   beclomethasone (QVAR) 80 MCG/ACT inhaler Inhale 1 puff into the lungs 2 (two) times daily.     fluticasone  (FLOVENT  HFA) 110 MCG/ACT inhaler Inhale 1 puff into the lungs daily. 12 g 1   omeprazole  (PRILOSEC ) 20 MG capsule Take 1 capsule (20 mg total) by mouth 2 (two) times daily before a meal. 180 capsule 1   albuterol  (VENTOLIN  HFA) 108 (90 Base) MCG/ACT inhaler Inhale 2 puffs into the lungs every 6 (six) hours as needed for wheezing or shortness of breath. (Patient not taking: Reported on 10/19/2023) 8 g 0   No facility-administered medications prior  to visit.    Allergies  Allergen Reactions   Iodine    Penicillins     childhood    Social History   Socioeconomic History   Marital status: Married    Spouse name: Not on file   Number of children: Not on file   Years of education: Not on file   Highest education level: Bachelor's degree (e.g., BA, AB, BS)  Occupational History   Not on file  Tobacco Use   Smoking status: Never   Smokeless tobacco: Not on file  Vaping Use   Vaping status: Never Used  Substance and Sexual Activity   Alcohol use: Yes    Comment: Social   Drug use: No   Sexual activity: Not on file  Other Topics Concern   Not on file  Social History Narrative   Not on file   Social Drivers of Health   Financial Resource Strain: Low Risk  (10/18/2023)   Overall Financial Resource Strain (CARDIA)    Difficulty of Paying Living Expenses: Not very hard  Food Insecurity: No Food Insecurity (10/18/2023)   Hunger Vital Sign    Worried About  Running Out of Food in the Last Year: Never true    Ran Out of Food in the Last Year: Never true  Transportation Needs: No Transportation Needs (10/18/2023)   PRAPARE - Administrator, Civil Service (Medical): No    Lack of Transportation (Non-Medical): No  Physical Activity: Sufficiently Active (10/18/2023)   Exercise Vital Sign    Days of Exercise per Week: 5 days    Minutes of Exercise per Session: 30 min  Stress: No Stress Concern Present (10/18/2023)   [employer] of Occupational Health - Occupational Stress Questionnaire    Feeling of Stress: Not at all  Social Connections: Socially Isolated (10/18/2023)   Social Connection and Isolation Panel    Frequency of Communication with Friends and Family: Once a week    Frequency of Social Gatherings with Friends and Family: Once a week    Attends Religious Services: Never    Database administrator or Organizations: No    Attends Engineer, structural: Not on file    Marital Status: Married            Objective:  Physical Exam: BP (!) 136/92 (BP Location: Left Arm, Patient Position: Sitting, Cuff Size: Normal)   Pulse 75   Temp (!) 97.3 F (36.3 C)   Ht 6' 1 (1.854 m)   Wt 239 lb (108.4 kg)   SpO2 97%   BMI 31.53 kg/m   Body mass index is 31.53 kg/m. Wt Readings from Last 3 Encounters:  10/19/23 239 lb (108.4 kg)  11/24/21 235 lb 9.6 oz (106.9 kg)  10/21/21 231 lb (104.8 kg)    Physical Exam Constitutional:      General: He is not in acute distress.    Appearance: Normal appearance. He is not ill-appearing or toxic-appearing.  HENT:     Head: Normocephalic and atraumatic.     Right Ear: Hearing, tympanic membrane, ear canal and external ear normal. There is no impacted cerumen.     Left Ear: Hearing, tympanic membrane, ear canal and external ear normal. There is no impacted cerumen.     Nose: Nose normal. No congestion.     Mouth/Throat:     Lips: No lesions.     Mouth: Mucous membranes are moist.     Pharynx: Oropharynx is clear. No oropharyngeal exudate.  Eyes:     General: No scleral icterus.       Right eye: No discharge.        Left eye: No discharge.     Conjunctiva/sclera: Conjunctivae normal.     Pupils: Pupils are equal, round, and reactive to light.  Neck:     Thyroid: No thyroid mass, thyromegaly or thyroid tenderness.  Cardiovascular:     Rate and Rhythm: Normal rate and regular rhythm.     Pulses: Normal pulses.     Heart sounds: Normal heart sounds.  Pulmonary:     Effort: Pulmonary effort is normal. No respiratory distress.     Breath sounds: Normal breath sounds.  Abdominal:     General: Abdomen is flat. Bowel sounds are normal.     Palpations: Abdomen is soft.  Musculoskeletal:        General: Normal range of motion.     Cervical back: Normal range of motion.     Right lower leg: No edema.     Left lower leg: No edema.  Lymphadenopathy:     Cervical: No cervical adenopathy.  Skin:    General: Skin  is warm and dry.      Findings: No rash.  Neurological:     General: No focal deficit present.     Mental Status: He is alert and oriented to person, place, and time. Mental status is at baseline.     Deep Tendon Reflexes:     Reflex Scores:      Patellar reflexes are 2+ on the right side and 2+ on the left side. Psychiatric:        Mood and Affect: Mood normal.        Behavior: Behavior normal.        Thought Content: Thought content normal.        Judgment: Judgment normal.         Prior labs:   No results found for this or any previous visit (from the past 2160 hours).  Lab Results  Component Value Date   CHOL 232 (H) 11/24/2021   Lab Results  Component Value Date   HDL 37.90 (L) 11/24/2021   No results found for: [facility] Lab Results  Component Value Date   TRIG 277.0 (H) 11/24/2021   Lab Results  Component Value Date   CHOLHDL 6 11/24/2021   Lab Results  Component Value Date   LDLDIRECT 160.0 11/24/2021    Last metabolic panel Lab Results  Component Value Date   GLUCOSE 98 11/24/2021   NA 139 11/24/2021   K 4.2 11/24/2021   CL 100 11/24/2021   CO2 29 11/24/2021   BUN 11 11/24/2021   CREATININE 1.20 11/24/2021   GFR 76.32 11/24/2021   CALCIUM 9.3 11/24/2021   PROT 7.4 11/24/2021   ALBUMIN 4.5 11/24/2021   BILITOT 0.4 11/24/2021   ALKPHOS 57 11/24/2021   AST 24 11/24/2021   ALT 31 11/24/2021    Lab Results  Component Value Date   HGBA1C 5.8 11/24/2021    Last CBC Lab Results  Component Value Date   WBC 7.0 11/24/2021   HGB 14.7 11/24/2021   HCT 44.2 11/24/2021   MCV 88.8 11/24/2021   RDW 14.8 11/24/2021   PLT 228.0 11/24/2021    Lab Results  Component Value Date   TSH 1.93 11/24/2021    No results found for: PSA1, PSA  Last vitamin D  No results found for: MARIEN BOLLS, VD25OH  Lab Results  Component Value Date   BILIRUBINUR NEGATIVE 11/24/2021   UROBILINOGEN 0.2 11/24/2021   LEUKOCYTESUR NEGATIVE 11/24/2021    No results  found for: LABMICR, MICROALBUR   At today's visit, we discussed treatment options, associated risk and benefits, and engage in counseling as needed.  Additionally the following were reviewed: Past medical records, past medical and surgical history, family and social background, as well as relevant laboratory results, imaging findings, and specialty notes, where applicable.  This message was generated using dictation software, and as a result, it may contain unintentional typos or errors.  Nevertheless, extensive effort was made to accurately convey at the pertinent aspects of the patient visit.    There may have been are other unrelated non-urgent complaints, but due to the busy schedule and the amount of time already spent with him, time does not permit to address these issues at today's visit. Another appointment may have or has been requested to review these additional issues.     Arvella Hummer, MD, MS

## 2023-10-21 DIAGNOSIS — I10 Essential (primary) hypertension: Secondary | ICD-10-CM | POA: Insufficient documentation

## 2024-04-25 ENCOUNTER — Other Ambulatory Visit (HOSPITAL_COMMUNITY): Payer: Self-pay

## 2024-04-30 ENCOUNTER — Other Ambulatory Visit (HOSPITAL_COMMUNITY): Payer: Self-pay

## 2024-10-22 ENCOUNTER — Encounter: Admitting: Family Medicine
